# Patient Record
Sex: Female | Born: 1985 | Race: Black or African American | Marital: Married | Smoking: Never smoker
Health system: Southern US, Community
[De-identification: ages and names within clinical notes are randomized; demographics above are authoritative.]

## PROBLEM LIST (undated history)

## (undated) DIAGNOSIS — Q210 Ventricular septal defect: Secondary | ICD-10-CM

## (undated) HISTORY — DX: Ventricular septal defect: Q21.0

---
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
## 2018-05-01 ENCOUNTER — Encounter: Payer: Self-pay | Admitting: Physician Assistant

## 2018-05-01 ENCOUNTER — Ambulatory Visit (INDEPENDENT_AMBULATORY_CARE_PROVIDER_SITE_OTHER): Payer: Managed Care, Other (non HMO) | Admitting: Physician Assistant

## 2018-05-01 VITALS — BP 120/72 | HR 68 | Temp 98.4°F | Ht 67.0 in | Wt 186.5 lb

## 2018-05-01 DIAGNOSIS — R300 Dysuria: Secondary | ICD-10-CM

## 2018-05-01 LAB — POCT URINALYSIS DIP (MANUAL ENTRY)
Bilirubin, UA: NEGATIVE
Glucose, UA: NEGATIVE mg/dL
Ketones, POC UA: NEGATIVE mg/dL
Nitrite, UA: NEGATIVE
PH UA: 6 (ref 5.0–8.0)
Protein Ur, POC: NEGATIVE mg/dL
Spec Grav, UA: 1.02 (ref 1.010–1.025)
Urobilinogen, UA: 0.2 E.U./dL

## 2018-05-01 LAB — POCT URINE PREGNANCY: Preg Test, Ur: NEGATIVE

## 2018-05-01 MED ORDER — NITROFURANTOIN MONOHYD MACRO 100 MG PO CAPS: 100.0000 mg | ORAL_CAPSULE | Freq: Two times a day (BID) | ORAL | 0 refills | Status: DC

## 2018-05-01 NOTE — Patient Instructions (Signed)
It was great to see you!  Start macrobid. We will be in touch with your culture results.  If it is too expensive, let us know.  Take care,  Jarold Motto PA-C  Contact a doctor if:  You do not get better after 1-2 days.  Your symptoms go away and then come back. Get help right away if:  You have very bad back pain.  You have very bad pain in your lower belly.  You have a fever.  You are sick to your stomach (nauseous).  You are throwing up. Summary  A urinary tract infection (UTI) is an infection of any part of the urinary tract.  This condition is caused by germs in your genital area.  There are many risk factors for a UTI. These include having a small, thin tube to drain pee and not being able to control when you pee or poop.  Treatment includes antibiotic medicines for germs.  Drink enough fluid to keep your pee pale yellow. This information is not intended to replace advice given to you by your health care provider. Make sure you discuss any questions you have with your health care provider.

## 2018-05-01 NOTE — Progress Notes (Signed)
Brittany Levine is a 33 y.o. female here for a new problem.  History of Present Illness:   Chief Complaint  Patient presents with  . Urinary Tract Infection    Burning, odor x3-4 days    Urinary Tract Infection   This is a new problem. The current episode started in the past 7 days. The problem occurs every urination. The quality of the pain is described as burning. The pain is mild. There has been no fever. She is sexually active. Associated symptoms include frequency and urgency. Pertinent negatives include no chills, discharge, flank pain, hematuria, hesitancy, nausea, possible pregnancy, sweats or vomiting. She has tried increased fluids for the symptoms. The treatment provided mild relief. Her past medical history is significant for recurrent UTIs.   Patient's last menstrual period was 04/27/2018.   Past Medical History:  Diagnosis Date  . VSD (ventricular septal defect)      Social History   Socioeconomic History  . Marital status: Married    Spouse name: Not on file  . Number of children: 1  . Years of education: Not on file  . Highest education level: Not on file  Occupational History  . Not on file  Social Needs  . Financial resource strain: Not on file  . Food insecurity:    Worry: Not on file    Inability: Not on file  . Transportation needs:    Medical: Not on file    Non-medical: Not on file  Tobacco Use  . Smoking status: Never Smoker  . Smokeless tobacco: Never Used  Substance and Sexual Activity  . Alcohol use: Yes  . Drug use: Never  . Sexual activity: Yes    Birth control/protection: None  Lifestyle  . Physical activity:    Days per week: Not on file    Minutes per session: Not on file  . Stress: Not on file  Relationships  . Social connections:    Talks on phone: Not on file    Gets together: Not on file    Attends religious service: Not on file    Active member of club or organization: Not on file    Attends meetings of clubs or  organizations: Not on file    Relationship status: Not on file  . Intimate partner violence:    Fear of current or ex partner: Not on file    Emotionally abused: Not on file    Physically abused: Not on file    Forced sexual activity: Not on file  Other Topics Concern  . Not on file  Social History Narrative   From [place], [place]   Works in IT   Has 33 y/o dtr   Married    Past Surgical History:  Procedure Laterality Date  . CESAREAN SECTION  2013  . RIGHT AND LEFT HEART CATH  1989    Family History  Problem Relation Age of Onset  . Prostate cancer Maternal Grandfather   . Birth defects Mother   . Miscarriages / [place] Mother   . Heart disease Maternal Grandmother   . Hypertension Maternal Grandmother     No Known Allergies  Current Medications:   Current Outpatient Medications:  .  Prenatal Vit-Fe Fumarate-FA (PRENATAL MULTIVITAMIN) TABS tablet, Take 1 tablet by mouth daily at 12 noon., Disp: , Rfl:  .  nitrofurantoin, macrocrystal-monohydrate, (MACROBID) 100 MG capsule, Take 1 capsule (100 mg total) by mouth 2 (two) times daily., Disp: 10 capsule, Rfl: 0   Review of Systems:  Review of Systems  Constitutional: Negative for chills.  Gastrointestinal: Negative for nausea and vomiting.  Genitourinary: Positive for frequency and urgency. Negative for flank pain, hematuria and hesitancy.    Vitals:   Vitals:   05/01/18 1402  BP: 120/72  Pulse: 68  Temp: 98.4 F (36.9 C)  TempSrc: Oral  SpO2: 99%  Weight: 186 lb 8 oz (84.6 kg)  Height: 5\' 7"  (1.702 m)     Body mass index is 29.21 kg/m.  Physical Exam:   Physical Exam Vitals signs and nursing note reviewed.  Constitutional:      General: She is not in acute distress.    Appearance: Normal appearance. She is well-developed. She is not ill-appearing or toxic-appearing.  Cardiovascular:     Rate and Rhythm: Normal rate and regular rhythm.     Pulses: Normal pulses.     Heart sounds: S1 normal and S2  normal. Murmur present.  Pulmonary:     Effort: Pulmonary effort is normal.     Breath sounds: Normal breath sounds.  Abdominal:     General: Bowel sounds are normal.     Tenderness: There is abdominal tenderness in the suprapubic area.  Skin:    General: Skin is warm and dry.  Neurological:     Mental Status: She is alert.     GCS: GCS eye subscore is 4. GCS verbal subscore is 5. GCS motor subscore is 6.  Psychiatric:        Speech: Speech normal.        Behavior: Behavior normal. Behavior is cooperative.     Results for orders placed or performed in visit on 05/01/18  POCT urinalysis dipstick  Result Value Ref Range   Color, UA yellow yellow   Clarity, UA clear clear   Glucose, UA negative negative mg/dL   Bilirubin, UA negative negative   Ketones, POC UA negative negative mg/dL   Spec Grav, UA 6.433 2.951 - 1.025   Blood, UA trace-lysed (A) negative   pH, UA 6.0 5.0 - 8.0   Protein Ur, POC negative negative mg/dL   Urobilinogen, UA 0.2 0.2 or 1.0 E.U./dL   Nitrite, UA Negative Negative   Leukocytes, UA Large (3+) (A) Negative  POCT urine pregnancy  Result Value Ref Range   Preg Test, Ur Negative Negative    Assessment and Plan:   Brittany Levine  was seen today for urinary tract infection.  Diagnoses and all orders for this visit:  Dysuria UA negative. Suspect acute cystitis. Start oral macrobid. Of note, she states that her old provider would use either bactrim or levaquin, but she doesn't remember trying any other antibiotics for UTI. Will trial macrobid and await culture. Worsening precautions advised. -     POCT urinalysis dipstick -     POCT urine pregnancy -     Urine Culture  Other orders -     nitrofurantoin, macrocrystal-monohydrate, (MACROBID) 100 MG capsule; Take 1 capsule (100 mg total) by mouth 2 (two) times daily.   . Reviewed expectations re: course of current medical issues. . Discussed self-management of symptoms. . Outlined signs and symptoms  indicating need for more acute intervention. . Patient verbalized understanding and all questions were answered. . See orders for this visit as documented in the electronic medical record. . Patient received an After-Visit Summary  Jarold Motto, PA-C

## 2018-05-03 ENCOUNTER — Ambulatory Visit: Payer: Self-pay | Admitting: Physician Assistant

## 2018-05-03 LAB — URINE CULTURE
MICRO NUMBER:: 233396
SPECIMEN QUALITY:: ADEQUATE

## 2018-07-05 ENCOUNTER — Other Ambulatory Visit: Payer: Self-pay

## 2018-07-05 ENCOUNTER — Encounter: Payer: Self-pay | Admitting: Physician Assistant

## 2018-07-05 ENCOUNTER — Ambulatory Visit (INDEPENDENT_AMBULATORY_CARE_PROVIDER_SITE_OTHER): Payer: Managed Care, Other (non HMO) | Admitting: Physician Assistant

## 2018-07-05 ENCOUNTER — Ambulatory Visit: Payer: Managed Care, Other (non HMO) | Admitting: Physician Assistant

## 2018-07-05 ENCOUNTER — Other Ambulatory Visit (INDEPENDENT_AMBULATORY_CARE_PROVIDER_SITE_OTHER): Payer: Managed Care, Other (non HMO)

## 2018-07-05 DIAGNOSIS — R300 Dysuria: Secondary | ICD-10-CM

## 2018-07-05 DIAGNOSIS — R109 Unspecified abdominal pain: Secondary | ICD-10-CM

## 2018-07-05 LAB — POCT URINALYSIS DIPSTICK
Bilirubin, UA: NEGATIVE
Blood, UA: NEGATIVE
Glucose, UA: NEGATIVE
Ketones, UA: NEGATIVE
Nitrite, UA: NEGATIVE
Protein, UA: NEGATIVE
Spec Grav, UA: 1.015 (ref 1.010–1.025)
Urobilinogen, UA: 0.2 E.U./dL
pH, UA: 8.5 — AB (ref 5.0–8.0)

## 2018-07-05 MED ORDER — CIPROFLOXACIN HCL 500 MG PO TABS: 500.0000 mg | ORAL_TABLET | Freq: Two times a day (BID) | ORAL | 0 refills | Status: AC

## 2018-07-05 NOTE — Progress Notes (Signed)
Virtual Visit via Video   I connected with Brittany Levine  Brittany Levine on 07/05/18 at  1:20 PM EDT by a video enabled telemedicine application and verified that I am speaking with the correct person using two identifiers. Location patient: Home Location provider: [facility], Office Persons participating in the virtual visit: Brittany Levine, Jarold MottoSamantha Eissa Buchberger, [place]   I discussed the limitations of evaluation and management by telemedicine and the availability of in person appointments. The patient expressed understanding and agreed to proceed.  Subjective:   HPI:   Patient reports 1 week history of flank pain, worsening with time. She has also had foul urine odor x 1 day, which resolved after pushing fluids. She has history of both UTI and pyelo in the past. No hx of kidney stones. Denies: fevers, chills, nausea, vomiting, poor appetite.  Patient's last menstrual period was 06/18/2018.   ROS: See pertinent positives and negatives per HPI.  There are no active problems to display for this patient.   Social History   Tobacco Use  . Smoking status: Never Smoker  . Smokeless tobacco: Never Used  Substance Use Topics  . Alcohol use: Yes    Current Outpatient Medications:  .  Prenatal Vit-Fe Fumarate-FA (PRENATAL MULTIVITAMIN) TABS tablet, Take 1 tablet by mouth daily at 12 noon., Disp: , Rfl:  .  ciprofloxacin (CIPRO) 500 MG tablet, Take 1 tablet (500 mg total) by mouth 2 (two) times daily for 10 days., Disp: 20 tablet, Rfl: 0  No Known Allergies  Objective:   VITALS: Per patient if applicable, see vitals. GENERAL: Alert, appears well and in no acute distress. HEENT: Atraumatic, conjunctiva clear, no obvious abnormalities on inspection of external nose and ears. NECK: Normal movements of the head and neck. CARDIOPULMONARY: No increased WOB. Speaking in clear sentences. I:E ratio WNL.  MS: Moves all visible extremities without noticeable abnormality. PSYCH: Pleasant and  cooperative, well-groomed. Speech normal rate and rhythm. Affect is appropriate. Insight and judgement are appropriate. Attention is focused, linear, and appropriate.  NEURO: CN grossly intact. Oriented as arrived to appointment on time with no prompting. Moves both UE equally.  SKIN: No obvious lesions, wounds, erythema, or cyanosis noted on face or hands.  Results for orders placed or performed in visit on 07/05/18  POCT urinalysis dipstick  Result Value Ref Range   Color, UA Yellow    Clarity, UA Clear    Glucose, UA Negative Negative   Bilirubin, UA Negative    Ketones, UA Negative    Spec Grav, UA 1.015 1.010 - 1.025   Blood, UA Negative    pH, UA 8.5 (A) 5.0 - 8.0   Protein, UA Negative Negative   Urobilinogen, UA 0.2 0.2 or 1.0 E.U./dL   Nitrite, UA Negative    Leukocytes, UA Small (1+) (A) Negative   Appearance     Odor       Assessment and Plan:   Brittany Levine  was seen today for acute visit.  Diagnoses and all orders for this visit:  Flank pain  Other orders -     ciprofloxacin (CIPRO) 500 MG tablet; Take 1 tablet (500 mg total) by mouth 2 (two) times daily for 10 days.   UA concerning for infection. She has used cipro in the past and tolerated well. Denies current pregnancy. Culture pending.  . Reviewed expectations re: course of current medical issues. . Discussed self-management of symptoms. . Outlined signs and symptoms indicating need for more acute intervention. . Patient verbalized  understanding and all questions were answered. Marland Kitchen Health Maintenance issues including appropriate healthy diet, exercise, and smoking avoidance were discussed with patient. . See orders for this visit as documented in the electronic medical record.  I discussed the assessment and treatment plan with the patient. The patient was provided an opportunity to ask questions and all were answered. The patient agreed with the plan and demonstrated an understanding of the instructions.   The  patient was advised to call back or seek an in-person evaluation if the symptoms worsen or if the condition fails to improve as anticipated.    [place], [place] 07/05/2018

## 2018-07-06 LAB — URINE CULTURE
MICRO NUMBER:: 432015
SPECIMEN QUALITY:: ADEQUATE

## 2018-07-10 ENCOUNTER — Ambulatory Visit (INDEPENDENT_AMBULATORY_CARE_PROVIDER_SITE_OTHER): Payer: Managed Care, Other (non HMO) | Admitting: Physician Assistant

## 2018-07-10 ENCOUNTER — Ambulatory Visit (INDEPENDENT_AMBULATORY_CARE_PROVIDER_SITE_OTHER)
Admission: RE | Admit: 2018-07-10 | Discharge: 2018-07-10 | Disposition: A | Discharge status: Home / Self Care | Payer: Managed Care, Other (non HMO) | Source: Ambulatory Visit | Attending: Physician Assistant | Admitting: Physician Assistant

## 2018-07-10 ENCOUNTER — Other Ambulatory Visit: Payer: Self-pay

## 2018-07-10 ENCOUNTER — Telehealth: Payer: Self-pay | Admitting: Physician Assistant

## 2018-07-10 ENCOUNTER — Telehealth: Payer: Self-pay | Admitting: *Deleted

## 2018-07-10 ENCOUNTER — Encounter: Payer: Self-pay | Admitting: Physician Assistant

## 2018-07-10 DIAGNOSIS — R109 Unspecified abdominal pain: Secondary | ICD-10-CM | POA: Diagnosis not present

## 2018-07-10 LAB — COMPREHENSIVE METABOLIC PANEL
ALT: 19 U/L (ref 0–35)
AST: 16 U/L (ref 0–37)
Albumin: 4.3 g/dL (ref 3.5–5.2)
Alkaline Phosphatase: 41 U/L (ref 39–117)
BUN: 8 mg/dL (ref 6–23)
CO2: 25 mEq/L (ref 19–32)
Calcium: 9.2 mg/dL (ref 8.4–10.5)
Chloride: 102 mEq/L (ref 96–112)
Creatinine, Ser: 0.73 mg/dL (ref 0.40–1.20)
GFR: 111.3 mL/min (ref >60.00)
Glucose, Bld: 64 mg/dL — ABNORMAL LOW (ref 70–99)
Potassium: 4.2 mEq/L (ref 3.5–5.1)
Sodium: 136 mEq/L (ref 135–145)
Total Bilirubin: 0.5 mg/dL (ref 0.2–1.2)
Total Protein: 7.5 g/dL (ref 6.0–8.3)

## 2018-07-10 LAB — CBC WITH DIFFERENTIAL/PLATELET
Basophils Absolute: 0 10*3/uL (ref 0.0–0.1)
Basophils Relative: 0.6 % (ref 0.0–3.0)
Eosinophils Absolute: 0 10*3/uL (ref 0.0–0.7)
Eosinophils Relative: 1.1 % (ref 0.0–5.0)
HCT: 40.8 % (ref 36.0–46.0)
Hemoglobin: 13.4 g/dL (ref 12.0–15.0)
Lymphocytes Relative: 50.3 % — ABNORMAL HIGH (ref 12.0–46.0)
Lymphs Abs: 2.3 10*3/uL (ref 0.7–4.0)
MCHC: 32.8 g/dL (ref 30.0–36.0)
MCV: 84.6 fl (ref 78.0–100.0)
Monocytes Absolute: 0.4 10*3/uL (ref 0.1–1.0)
Monocytes Relative: 8.3 % (ref 3.0–12.0)
Neutro Abs: 1.9 10*3/uL (ref 1.4–7.7)
Neutrophils Relative %: 39.7 % — ABNORMAL LOW (ref 43.0–77.0)
Platelets: 182 10*3/uL (ref 150.0–400.0)
RBC: 4.83 Mil/uL (ref 3.87–5.11)
RDW: 13.9 % (ref 11.5–15.5)
WBC: 4.7 10*3/uL (ref 4.0–10.5)

## 2018-07-10 LAB — URINALYSIS, ROUTINE W REFLEX MICROSCOPIC
Bilirubin Urine: NEGATIVE
Hgb urine dipstick: NEGATIVE
Ketones, ur: NEGATIVE
Nitrite: NEGATIVE
Specific Gravity, Urine: 1.025 (ref 1.000–1.030)
Total Protein, Urine: NEGATIVE
Urine Glucose: NEGATIVE
Urobilinogen, UA: 0.2 (ref 0.0–1.0)
pH: 5.5 (ref 5.0–8.0)

## 2018-07-10 LAB — POCT URINE PREGNANCY: Preg Test, Ur: NEGATIVE

## 2018-07-10 NOTE — Telephone Encounter (Signed)
See note  Copied from CRM [id number]. Topic: General - Other >> Jul 10, 2018  9:23 AM Herby Abraham C wrote: Reason for CRM: pt was prescribed ciprofloxacin (CIPRO) 500 MG tablet. Pt says that she has almost completed medication and is not feeling any better. Pt would like to be advised further.   CB: [phone]

## 2018-07-10 NOTE — Telephone Encounter (Signed)
Spoke to pt asked her what is wrong. Pt said she is still having Flank pain R>L. Told pt urine culture was negative can stop antibiotic per Lelon Mast. PT verbalized understanding. Pt denies dysuria, frequency, no hematuria  Just having pain. Doxy visit scheduled for Sam to day at 10:40. Pt verbalized understanding.

## 2018-07-10 NOTE — Telephone Encounter (Signed)
Please obtain more information about what symptoms she is still having. Please offer doxy.

## 2018-07-10 NOTE — Telephone Encounter (Signed)
Covid-19 travel screening questions  Have you traveled in the last 14 days? If yes where? No Do you now or have you had a fever in the last 14 days? No Do you have any respiratory symptoms of shortness of breath or cough now or in the last 14 days? No Do you have a medical history of Congestive Heart Failure? No Do you have a medical history of lung disease? No Do you have any family members or close contacts with diagnosed or suspected Covid-19? No      

## 2018-07-10 NOTE — Progress Notes (Signed)
Virtual Visit via Video   I connected with Brittany Levine on 07/11/18 at 10:40 AM EDT by a video enabled telemedicine application and verified that I am speaking with the correct person using two identifiers. Location patient: Home Location provider: [facility], Office Persons participating in the virtual visit: Mellissa Thau, Jarold Motto, [place]   I discussed the limitations of evaluation and management by telemedicine and the availability of in person appointments. The patient expressed understanding and agreed to proceed.  Subjective:   HPI:  Flank pain Pt c/o bilateral flank pain R>L. She was seen on 07/05/18 for similar symptoms. UA showed small leuks and she was treated with cipro. Culture was negative for significant growth. Pt denies dysuria, frequency with urination, hematuria, poor appetite. She rates her pain 8/10, worse when moving.  Patient's last menstrual period was 06/18/2018. Husband has had vasectomy.   ROS: See pertinent positives and negatives per HPI.  There are no active problems to display for this patient.   Social History   Tobacco Use  . Smoking status: Never Smoker  . Smokeless tobacco: Never Used  Substance Use Topics  . Alcohol use: Yes    Current Outpatient Medications:  .  ciprofloxacin (CIPRO) 500 MG tablet, Take 1 tablet (500 mg total) by mouth 2 (two) times daily for 10 days., Disp: 20 tablet, Rfl: 0 .  Prenatal Vit-Fe Fumarate-FA (PRENATAL MULTIVITAMIN) TABS tablet, Take 1 tablet by mouth daily at 12 noon., Disp: , Rfl:   No Known Allergies  Objective:   VITALS: Per patient if applicable, see vitals. GENERAL: Alert, appears well and in no acute distress. HEENT: Atraumatic, conjunctiva clear, no obvious abnormalities on inspection of external nose and ears. NECK: Normal movements of the head and neck. CARDIOPULMONARY: No increased WOB. Speaking in clear sentences. I:E ratio WNL.  MS: Moves all visible extremities without  noticeable abnormality. PSYCH: Pleasant and cooperative, well-groomed. Speech normal rate and rhythm. Affect is appropriate. Insight and judgement are appropriate. Attention is focused, linear, and appropriate.  NEURO: CN grossly intact. Oriented as arrived to appointment on time with no prompting. Moves both UE equally.  SKIN: No obvious lesions, wounds, erythema, or cyanosis noted on face or hands.  Results for orders placed or performed in visit on 07/10/18  CBC with Differential/Platelet  Result Value Ref Range   WBC 4.7 4.0 - 10.5 K/uL   RBC 4.83 3.87 - 5.11 Mil/uL   Hemoglobin 13.4 12.0 - 15.0 g/dL   HCT 20.2 54.2 - 70.6 %   MCV 84.6 78.0 - 100.0 fl   MCHC 32.8 30.0 - 36.0 g/dL   RDW 23.7 62.8 - 31.5 %   Platelets 182.0 150.0 - 400.0 K/uL   Neutrophils Relative % 39.7 (L) 43.0 - 77.0 %   Lymphocytes Relative 50.3 (H) 12.0 - 46.0 %   Monocytes Relative 8.3 3.0 - 12.0 %   Eosinophils Relative 1.1 0.0 - 5.0 %   Basophils Relative 0.6 0.0 - 3.0 %   Neutro Abs 1.9 1.4 - 7.7 K/uL   Lymphs Abs 2.3 0.7 - 4.0 K/uL   Monocytes Absolute 0.4 0.1 - 1.0 K/uL   Eosinophils Absolute 0.0 0.0 - 0.7 K/uL   Basophils Absolute 0.0 0.0 - 0.1 K/uL  Comprehensive metabolic panel  Result Value Ref Range   Sodium 136 135 - 145 mEq/L   Potassium 4.2 3.5 - 5.1 mEq/L   Chloride 102 96 - 112 mEq/L   CO2 25 19 - 32  mEq/L   Glucose, Bld 64 (L) 70 - 99 mg/dL   BUN 8 6 - 23 mg/dL   Creatinine, Ser 1.610.73 0.40 - 1.20 mg/dL   Total Bilirubin 0.5 0.2 - 1.2 mg/dL   Alkaline Phosphatase 41 39 - 117 U/L   AST 16 0 - 37 U/L   ALT 19 0 - 35 U/L   Total Protein 7.5 6.0 - 8.3 g/dL   Albumin 4.3 3.5 - 5.2 g/dL   Calcium 9.2 8.4 - 09.610.5 mg/dL   GFR 045.40111.30 >98.11>60.00 mL/min  Urinalysis, Routine w reflex microscopic  Result Value Ref Range   Color, Urine YELLOW Yellow;Lt. Yellow;Straw;Dark Yellow;Amber;Green;Red;Brown   APPearance CLEAR Clear;Turbid;Slightly Cloudy;Cloudy   Specific Gravity, Urine 1.025 1.000 - 1.030    pH 5.5 5.0 - 8.0   Total Protein, Urine NEGATIVE Negative   Urine Glucose NEGATIVE Negative   Ketones, ur NEGATIVE Negative   Bilirubin Urine NEGATIVE Negative   Hgb urine dipstick NEGATIVE Negative   Urobilinogen, UA 0.2 0.0 - 1.0   Leukocytes,Ua TRACE (A) Negative   Nitrite NEGATIVE Negative   WBC, UA 0-2/hpf 0-2/hpf   RBC / HPF 0-2/hpf 0-2/hpf   Squamous Epithelial / LPF Rare(0-4/hpf) Rare(0-4/hpf)  POCT urine pregnancy  Result Value Ref Range   Preg Test, Ur Negative Negative     Assessment and Plan:   Brittany Levine  was seen today for flank pain.  Diagnoses and all orders for this visit:  Flank pain -     CBC with Differential/Platelet -     Comprehensive metabolic panel -     Urinalysis, Routine w reflex microscopic -     Urine Culture -     POCT urine pregnancy -     CT RENAL STONE STUDY; Future   Will check urinalysis, culture, labs and CT scan for further work-up. ER precautions advised.  . Reviewed expectations re: course of current medical issues. . Discussed self-management of symptoms. . Outlined signs and symptoms indicating need for more acute intervention. . Patient verbalized understanding and all questions were answered. Marland Kitchen. Health Maintenance issues including appropriate healthy diet, exercise, and smoking avoidance were discussed with patient. . See orders for this visit as documented in the electronic medical record.  I discussed the assessment and treatment plan with the patient. The patient was provided an opportunity to ask questions and all were answered. The patient agreed with the plan and demonstrated an understanding of the instructions.   The patient was advised to call back or seek an in-person evaluation if the symptoms worsen or if the condition fails to improve as anticipated.    [place] CreekSamantha Jaqwon [place], [place] 07/11/2018

## 2018-07-10 NOTE — Telephone Encounter (Signed)
Please see message and advise 

## 2018-07-11 ENCOUNTER — Encounter: Payer: Self-pay | Admitting: Physician Assistant

## 2018-07-12 LAB — URINE CULTURE
MICRO NUMBER:: 442628
Result:: NO GROWTH
SPECIMEN QUALITY:: ADEQUATE

## 2018-07-17 ENCOUNTER — Other Ambulatory Visit: Payer: Self-pay

## 2018-07-17 ENCOUNTER — Encounter: Payer: Self-pay | Admitting: Physician Assistant

## 2018-07-17 ENCOUNTER — Ambulatory Visit (INDEPENDENT_AMBULATORY_CARE_PROVIDER_SITE_OTHER): Payer: Managed Care, Other (non HMO) | Admitting: Physician Assistant

## 2018-07-17 DIAGNOSIS — E162 Hypoglycemia, unspecified: Secondary | ICD-10-CM

## 2018-07-17 NOTE — Progress Notes (Signed)
Virtual Visit via Video   I connected with Brittany Levine on 07/17/18 at  4:00 PM EDT by a video enabled telemedicine application and verified that I am speaking with the correct person using two identifiers. Location patient: Home Location provider: [facility], Office Persons participating in the virtual visit: Brittany Levine, Jarold MottoSamantha Haly Feher, [place], [name], LPN  I discussed the limitations of evaluation and management by telemedicine and the availability of in person appointments. The patient expressed understanding and agreed to proceed.  I acted as a Neurosurgeonscribe for Energy East CorporationSamantha Herold Salguero, Avon ProductsPA-C [name], LPN  Subjective:   HPI: Pt following up on low blood sugar found on labs done 5/4.   Lightheadedness Pt says she has been having symptoms of dizziness, lightheadedness for years, but states that she thought it was from getting up too fast per her cardiologist. Pt said she was told prior to pregnancy that her blood sugar was low, but after daughter was born blood sugar was "back to normal" so she didn't really think much about it. Pt said when she has these symptoms of dizziness and lightheaded she will drink regular soda or gatorade and then she typically feels better. Pt said she was lightheaded this morning when she was working out and stopped and drank something and then felt better.  She has been making an effort of trying to have CHO-containing sources throughout the day since she was told about her labs.  ROS: See pertinent positives and negatives per HPI.  There are no active problems to display for this patient.   Social History   Tobacco Use  . Smoking status: Never Smoker  . Smokeless tobacco: Never Used  Substance Use Topics  . Alcohol use: Yes    Current Outpatient Medications:  .  Prenatal Vit-Fe Fumarate-FA (PRENATAL MULTIVITAMIN) TABS tablet, Take 1 tablet by mouth daily at 12 noon., Disp: , Rfl:   No Known Allergies  Objective:   VITALS: Per  patient if applicable, see vitals. GENERAL: Alert, appears well and in no acute distress. HEENT: Atraumatic, conjunctiva clear, no obvious abnormalities on inspection of external nose and ears. NECK: Normal movements of the head and neck. CARDIOPULMONARY: No increased WOB. Speaking in clear sentences. I:E ratio WNL.  MS: Moves all visible extremities without noticeable abnormality. PSYCH: Pleasant and cooperative, well-groomed. Speech normal rate and rhythm. Affect is appropriate. Insight and judgement are appropriate. Attention is focused, linear, and appropriate.  NEURO: CN grossly intact. Oriented as arrived to appointment on time with no prompting. Moves both UE equally.  SKIN: No obvious lesions, wounds, erythema, or cyanosis noted on face or hands.  Assessment and Plan:   Brittany Levine  was seen today for discuss blood sugar and dizziness.  Diagnoses and all orders for this visit:  Hypoglycemia -     Ambulatory referral to Endocrinology   Will send to endocrinology for further work-up. She is aware of when she becomes symptomatic and knows to carry sources of carbohydrates around regularly in case she needs them. I have also given her a glucometer to measure her blood sugar when she has symptoms for endocrinology to review.  . Reviewed expectations re: course of current medical issues. . Discussed self-management of symptoms. . Outlined signs and symptoms indicating need for more acute intervention. . Patient verbalized understanding and all questions were answered. Marland Kitchen. Health Maintenance issues including appropriate healthy diet, exercise, and smoking avoidance were discussed with patient. . See orders for this visit as documented in  the electronic medical record.  I discussed the assessment and treatment plan with the patient. The patient was provided an opportunity to ask questions and all were answered. The patient agreed with the plan and demonstrated an understanding of the  instructions.   The patient was advised to call back or seek an in-person evaluation if the symptoms worsen or if the condition fails to improve as anticipated.   CMA or LPN served as scribe during this visit. History, Physical, and Plan performed by medical provider. The above documentation has been reviewed and is accurate and complete.  [place], [place] 07/17/2018

## 2018-07-26 ENCOUNTER — Ambulatory Visit (INDEPENDENT_AMBULATORY_CARE_PROVIDER_SITE_OTHER): Payer: Managed Care, Other (non HMO) | Admitting: Internal Medicine

## 2018-07-26 ENCOUNTER — Other Ambulatory Visit: Payer: Self-pay

## 2018-07-26 ENCOUNTER — Encounter: Payer: Self-pay | Admitting: Internal Medicine

## 2018-07-26 DIAGNOSIS — R42 Dizziness and giddiness: Secondary | ICD-10-CM | POA: Diagnosis not present

## 2018-07-26 NOTE — Progress Notes (Signed)
Virtual Visit via Video Note  I connected with Brittany Levine  Brittany Levine on 07/26/18 at  8:50 AM EDT by a video enabled telemedicine application and verified that I am speaking with the correct person using two identifiers.   I discussed the limitations of evaluation and management by telemedicine and the availability of in person appointments. The patient expressed understanding and agreed to proceed.  -Location of the patient : Home  -Location of the provider : office -The names of all persons participating in the telemedicine service : Pt and myself     Name: Brittany Levine  Brittany Harth  MRN/ DOB: [id number], [birth year]    Age/ Sex: 33 y.o., female    PCP: Jarold MottoWorley, Samantha, PA   Reason for Endocrinology Evaluation: Hypoglycemia     Date of Initial Endocrinology Evaluation: 07/26/2018     HPI: [name]  Brittany Levine is a 33 y.o. female with a past medical history of VSD. The patient presented for initial endocrinology clinic visit on 07/26/2018 for consultative assistance with her Hypoglycemia   Pt is sent here for further evaluation of hypoglycemia after she was found to have a serum glucose of 64 mg/dL. She was being evaluated for a UTI at the time.  Pt does endorse positional lightheadedness, and dizziness since the age of 33. This has been attributes to her hx of VSD as its mainly positional. She does note resolution of symptoms with drinking a regular soda or a gatorade.   She is athletic and exercises 4-5x a day. She does notice these symptoms during exercise and when she wakes up in the morning when trying to get out of bed, pt also notes these symptoms when she is changing positions from a sit down to a standing position. Pt does not notice these symptoms with certain meals. She also does not recall if she felt any different during the lab testing.   Pt has been checking glucose with BG's 68-127 mg/dL.    No close contact with someone with diabetes  She works in IT     HISTORY:   Past Medical History:  Past Medical History:  Diagnosis Date  . VSD (ventricular septal defect)     Past Surgical History:  Past Surgical History:  Procedure Laterality Date  . CESAREAN SECTION  2013  . RIGHT AND LEFT HEART CATH  1989      Social History:  reports that she has never smoked. She has never used smokeless tobacco. She reports current alcohol use. She reports that she does not use drugs.  Family History: family history includes Birth defects in her mother; Heart disease in her maternal grandmother; Hypertension in her maternal grandmother; Miscarriages / [place]s in her mother; Prostate cancer in her maternal grandfather.   HOME MEDICATIONS: Allergies as of 07/26/2018   No Known Allergies     Medication List       Accurate as of Jul 26, 2018  8:07 AM. If you have any questions, ask your nurse or doctor.        prenatal multivitamin Tabs tablet Take 1 tablet by mouth daily at 12 noon.         REVIEW OF SYSTEMS: A comprehensive ROS was conducted with the patient and is negative except as per HPI and below:  Review of Systems  Constitutional: Negative for fever and weight loss.  HENT: Negative for congestion and sore throat.   Eyes: Negative for blurred vision and pain.  Respiratory: Negative for cough and shortness of breath.  Cardiovascular: Negative for chest pain and palpitations.  Gastrointestinal: Negative for diarrhea and nausea.  Genitourinary: Negative for frequency.  Neurological: Negative for tingling and tremors.  Endo/Heme/Allergies: Negative for polydipsia.  Psychiatric/Behavioral: Negative for depression. The patient is not nervous/anxious.         DATA REVIEWED: Results for VICIE, BRAUTIGAN  [facility] (MRN [id number]) as of 07/26/2018 10:08  Ref. Range 07/10/2018 11:59  Sodium Latest Ref Range: 135 - 145 mEq/L 136  Potassium Latest Ref Range: 3.5 - 5.1 mEq/L 4.2  Chloride Latest Ref Range: 96 - 112 mEq/L 102  CO2 Latest Ref Range: 19 -  32 mEq/L 25  Glucose Latest Ref Range: 70 - 99 mg/dL 64 (L)  BUN Latest Ref Range: 6 - 23 mg/dL 8  Creatinine Latest Ref Range: 0.40 - 1.20 mg/dL 3.15  Calcium Latest Ref Range: 8.4 - 10.5 mg/dL 9.2  Alkaline Phosphatase Latest Ref Range: 39 - 117 U/L 41  Albumin Latest Ref Range: 3.5 - 5.2 g/dL 4.3  AST Latest Ref Range: 0 - 37 U/L 16  ALT Latest Ref Range: 0 - 35 U/L 19  Total Protein Latest Ref Range: 6.0 - 8.3 g/dL 7.5  Total Bilirubin Latest Ref Range: 0.2 - 1.2 mg/dL 0.5  GFR Latest Ref Range: >60.00 mL/min 111.30      ASSESSMENT/PLAN/RECOMMENDATIONS:   1. Dizziness :  - Pt does not meet criteria for Whipple's Triad  - I have a low suspicion for insulinoma in this pt.  - Her history of low glucose during pregnancy is consistent with normal physiology during pregnancy - Having a serum glucose of 64 mg/dL in a healthy female who is not on any hypoglycemic agents is acceptable . Women generally could handle BG's in the high 50's mg/dL.  - Her BG's at home have been 68-127 mg/dL. Pt encouraged to continued glucose checks when symptomatic, the patient to contact us if her glucose readings are consistently in the mid 50's or less.   - Recent CT imaging are normal with no evidence of pancreatic tumors. - I do agree with cardiology that her dizziness is cardiac related given the nature of symptoms with change in positions.    I discussed the assessment and treatment plan with the patient. The patient was provided an opportunity to ask questions and all were answered. The patient agreed with the plan and demonstrated an understanding of the instructions.   The patient was advised to call back or seek an in-person evaluation if the symptoms worsen or if the condition fails to improve as anticipated.  F/u PRN    Signed electronically by: Lyndle Herrlich, MD  [facility] Endocrinology  [facility] [address] [place], [place] [address] Phone:  [phone] FAX: [phone]   CC: Jarold Motto, [place] [address] [place] [address] Phone: [phone] Fax: [phone]   Return to Endocrinology clinic as below: Future Appointments  Date Time Provider Department Center  07/26/2018  8:50 AM Ramsey Midgett, Konrad Dolores, MD [facility] None

## 2018-08-30 ENCOUNTER — Other Ambulatory Visit: Payer: Self-pay

## 2018-08-30 ENCOUNTER — Encounter: Payer: Self-pay | Admitting: Family Medicine

## 2018-08-30 ENCOUNTER — Ambulatory Visit: Payer: Managed Care, Other (non HMO) | Admitting: Family Medicine

## 2018-08-30 VITALS — BP 118/70 | HR 77 | Temp 98.6°F | Ht 67.0 in | Wt 183.8 lb

## 2018-08-30 DIAGNOSIS — L739 Follicular disorder, unspecified: Secondary | ICD-10-CM | POA: Diagnosis not present

## 2018-08-30 MED ORDER — DOXYCYCLINE HYCLATE 100 MG PO TABS: 100.0000 mg | ORAL_TABLET | Freq: Two times a day (BID) | ORAL | 0 refills | Status: DC

## 2018-08-30 NOTE — Progress Notes (Signed)
   Chief Complaint:  Brittany Levine is a 33 y.o. female who presents today with a chief complaint of recurrent skin infections.   Assessment/Plan:  Folliculitis No red flags.  Start doxycycline 100 mg twice daily for 7 days.  Also recommended Hibiclens few times per week for the next week or so.  Discussed reasons to return to care.  Follow-up as needed.    Subjective:  HPI:  Recurrent Skin Infections  Started about a month ago.  She has had several areas of infection throughout her body and currently has on in her chest, right side of her neck, and left leg.  Areas are painful to touch.  She had 1 area that drained on her left buttock about a week ago.  She has never had anything like this before.  Has tried using warm compresses with no improvement.  No fevers or chills.  No new soaps, detergents, lotions, or perfumes.  No other obvious precipitating events.  No other obvious alleviating or aggravating factors.   ROS: Per HPI  PMH: She reports that she has never smoked. She has never used smokeless tobacco. She reports current alcohol use. She reports that she does not use drugs.      Objective:  Physical Exam: BP 118/70 (BP Location: Left Arm, Patient Position: Sitting, Cuff Size: Normal)   Pulse 77   Temp 98.6 F (37 C) (Oral)   Ht 5\' 7"  (1.702 m)   Wt 183 lb 12.8 oz (83.4 kg)   SpO2 98%   BMI 28.79 kg/m   Gen: NAD, resting comfortably Skin: Several scattered, discrete, erythematous papules approximately 4 mm in diameter scattered across right neck, right upper chest, and bilateral lower extremities.     Algis Greenhouse. Jerline Pain, MD 08/30/2018 9:34 AM

## 2018-08-30 NOTE — Patient Instructions (Signed)
It was very nice to see you today!  You have infected hair follicles.  This is called folliculitis.  Please start the doxycycline.  Please use the Hibiclens body wash 2-3 times over the next week.  Please let me know if your symptoms do not improve with this.  Take care, Dr Jerline Pain

## 2018-09-23 ENCOUNTER — Encounter: Payer: Self-pay | Admitting: Family Medicine

## 2018-09-23 ENCOUNTER — Other Ambulatory Visit: Payer: Self-pay | Admitting: Critical Care Medicine

## 2018-09-23 ENCOUNTER — Ambulatory Visit (INDEPENDENT_AMBULATORY_CARE_PROVIDER_SITE_OTHER): Payer: Managed Care, Other (non HMO) | Admitting: Family Medicine

## 2018-09-23 ENCOUNTER — Other Ambulatory Visit: Payer: Self-pay

## 2018-09-23 VITALS — Temp 98.6°F

## 2018-09-23 DIAGNOSIS — R509 Fever, unspecified: Secondary | ICD-10-CM

## 2018-09-23 DIAGNOSIS — R52 Pain, unspecified: Secondary | ICD-10-CM | POA: Diagnosis not present

## 2018-09-23 DIAGNOSIS — Z20822 Contact with and (suspected) exposure to covid-19: Secondary | ICD-10-CM

## 2018-09-23 NOTE — Patient Instructions (Signed)
Health Maintenance Due  Topic Date Due  . HIV Screening  10/26/2000  . PAP SMEAR-Modifier  10/27/2006    Depression screen [facility] 2/9 08/30/2018 05/01/2018  Decreased Interest 0 0  Down, Depressed, Hopeless 0 0  PHQ - 2 Score 0 0

## 2018-09-23 NOTE — Progress Notes (Signed)
Phone [phone]   Subjective:  Virtual visit via Video note. AFter hours/saturday clinic. Chief complaint: Chief Complaint  Patient presents with  . Generalized Body Aches  . Fever    This visit type was conducted due to national recommendations for restrictions regarding the COVID-19 Pandemic (e.g. social distancing).  This format is felt to be most appropriate for this patient at this time balancing risks to patient and risks to population by having him in for in person visit.  No physical exam was performed (except for noted visual exam or audio findings with Telehealth visits).    Our team/I connected with Alison Murray at  9:00 AM EDT by a video enabled telemedicine application (doxy.me or caregility through epic) and verified that I am speaking with the correct person using two identifiers.  Location patient: Home[place] Location provider: [facility], office Persons participating in the virtual visit:  patient  Our team/I discussed the limitations of evaluation and management by telemedicine and the availability of in person appointments. In light of current covid-19 pandemic, patient also understands that we are trying to protect them by minimizing in office contact if at all possible.  The patient expressed consent for telemedicine visit and agreed to proceed. Patient understands insurance will be billed.   ROS-  Pt c/o fever, chills, body aches, headache yesterday, no fever or headache today but still having body aches and chills.  Past Medical History-  Patient Active Problem List   Diagnosis Date Noted  . Orthostatic dizziness 07/26/2018    Medications- reviewed and updated Current Outpatient Medications  Medication Sig Dispense Refill  . Prenatal Vit-Fe Fumarate-FA (PRENATAL MULTIVITAMIN) TABS tablet Take 1 tablet by mouth daily at 12 noon.     No current facility-administered medications for this visit.      Objective:  Temp 98.6 F (37 C) (Oral)   LMP  09/03/2018  self reported vitals Gen: NAD, resting comfortably Lungs: nonlabored, normal respiratory rate  Skin: appears dry, no obvious rash    Assessment and Plan   # Fever/body aches S: started feeling poorly yesterday. When she got home she suddenly started with fever, body aches, chills. She was born with a VSD and has history of getting pneumonia. Has some nasal congestion on and off for several weeks. Had headaches yesterday. Fever was up to 99.1 yesterday. She denies shortness of breath.   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 Reports a lot of stress- has had 6 family members diagnosed with covid 38 and 2 of them died and has another family member in the hospital trying to get blood sugar under control. She her daughter and husband at home. She is at home most of time other than grocery shopping- husband does most of the shopping.  A/P: Patient with symptoms concerning for potential covid 19 Therefore: - testing ordered for covid 19 and information provided on drive up testing  - recommended patient watch closely for shortness of breath or confusion or worsening symptoms and if those occur he should contact us immediately  -recommended self isolation even in her home from husband until negative test AND 3 days fever free AND improvement in respiratory symptoms AND 14 days from symptom onset at minmum - work note not needed- can work from home.  -discussed tylenol/ibuprofen for body aches - if test comes back positive- any contacts she has had probably  within the last 2 weeks should get tested including daughter and husband.   ________________________________________________ We have put in an order for you to be tested for COVID-19.  You do not need an appointment to go get testing -- please proceed to one of the following drive-up testing locations at your convenience. The line closes at  3:30pm daily. Test results may take up to a week to return.  [place] Location: [address], [facility] Hours: 8a-3:45p, M-F  Recommended follow up: prn for acute issue    Lab/Order associations:   ICD-10-CM   1. Body aches  R52 Novel Coronavirus, NAA ([facility])    CANCELED: Novel Coronavirus, NAA ([facility])  2. Fever, unspecified  R50.9 Novel Coronavirus, NAA ([facility])    CANCELED: Novel Coronavirus, NAA ([facility])   Return precautions advised.  Tana ConchStephen Eliz Nigg, MD

## 2018-09-27 LAB — NOVEL CORONAVIRUS, NAA: SARS-CoV-2, NAA: NOT DETECTED

## 2018-10-26 ENCOUNTER — Encounter: Payer: Self-pay | Admitting: Physician Assistant

## 2018-10-26 ENCOUNTER — Ambulatory Visit (INDEPENDENT_AMBULATORY_CARE_PROVIDER_SITE_OTHER): Payer: Managed Care, Other (non HMO) | Admitting: Physician Assistant

## 2018-10-26 VITALS — Ht 67.0 in | Wt 180.0 lb

## 2018-10-26 DIAGNOSIS — Q210 Ventricular septal defect: Secondary | ICD-10-CM | POA: Diagnosis not present

## 2018-10-26 DIAGNOSIS — H9201 Otalgia, right ear: Secondary | ICD-10-CM

## 2018-10-26 DIAGNOSIS — I459 Conduction disorder, unspecified: Secondary | ICD-10-CM

## 2018-10-26 MED ORDER — AMOXICILLIN 875 MG PO TABS: 875.0000 mg | ORAL_TABLET | Freq: Two times a day (BID) | ORAL | 0 refills | Status: DC

## 2018-10-26 NOTE — Progress Notes (Signed)
Virtual Visit via Video   I connected with Brittany Levine  Brittany Levine on 10/26/18 at  8:00 AM EDT by a video enabled telemedicine application and verified that I am speaking with the correct person using two identifiers. Location patient: Home Location provider: [facility], Office Persons participating in the virtual visit: Brittany Levine  Brittany Levine, Ziggy Chanthavong PA-C, Brittany Levine  I discussed the limitations of evaluation and management by telemedicine and the availability of in person appointments. The patient expressed understanding and agreed to proceed.  I acted as a Neurosurgeonscribe for Energy East CorporationSamantha Crist Levine, Avon ProductsPA-C Brittany Orphanos, LPN  Subjective:   HPI:  Otalgia Pt c/o constant dull pain right ear x 2 weeks. Denies drainage. Pt said she can hear her heartbeat in her ear. She is having some chills off and on but no fever. Has not tried any medication. No swimming recently. Pain is 3/10. Feels like something is clogged in her ear. Does have nasal drainage. Doesn't like to do nasal sprays.  VSD Pt would like a referral to Cardiology due to hx of VSD.  She states that in the past she has had to use a Holter monitor because she was having sensation of skipped beats around age [age].  These episodes happen intermittently, usually last about 1 second, however since she has moved here she has not had any cardiology follow-up.  And now she is having the sensations during activity specifically, had 1 episode that lasted a little bit longer this morning (5 sec) while working out.  They are usually related to caffeine but she is drinking as little caffeine as she has ever has.  Denies chest pain, shortness of breath, left arm pain, lower leg swelling.     ROS: See pertinent positives and negatives per HPI.  Patient Active Problem List   Diagnosis Date Noted  . Orthostatic dizziness 07/26/2018    Social History   Tobacco Use  . Smoking status: Never Smoker  . Smokeless tobacco: Never Used  Substance Use  Topics  . Alcohol use: Yes    Current Outpatient Medications:  .  Prenatal Vit-Fe Fumarate-FA (PRENATAL MULTIVITAMIN) TABS tablet, Take 1 tablet by mouth daily at 12 noon., Disp: , Rfl:   No Known Allergies  Objective:   VITALS: Per patient if applicable, see vitals. GENERAL: Alert, appears well and in no acute distress. HEENT: Atraumatic, conjunctiva clear, no obvious abnormalities on inspection of external nose and ears. NECK: Normal movements of the head and neck. CARDIOPULMONARY: No increased WOB. Speaking in clear sentences. I:E ratio WNL.  MS: Moves all visible extremities without noticeable abnormality. PSYCH: Pleasant and cooperative, well-groomed. Speech normal rate and rhythm. Affect is appropriate. Insight and judgement are appropriate. Attention is focused, linear, and appropriate.  NEURO: CN grossly intact. Oriented as arrived to appointment on time with no prompting. Moves both UE equally.  SKIN: No obvious lesions, wounds, erythema, or cyanosis noted on face or hands.  Assessment and Plan:   Brittany Levine  was seen today for otalgia.  Diagnoses and all orders for this visit:  Right ear pain No red flags on discussion with patient.  Based on her history, I have told her that she could likely have an ear infection, eustachian tube dysfunction, or even cerumen impaction.  For now we are going to conservatively treat with amoxicillin and have her try some nasal lavage.  I would avoid Sudafed as she is currently having sensation of intermittent skipped heartbeats.  I also said that she could consider  taking some Mucinex to thin out her potential nasal discharge.  We did go and make an appointment in the office on Monday for her to be evaluated in case her symptoms do not improve.  Worsening precautions advised.  VSD (ventricular septal defect); Skipped heart beats Cardiology referral.  She knows to not work out when she has these symptoms, and I discussed with her to avoid any  strenuous activity until she sees cardiology for further work-up since this is potentially a new trigger of her symptoms.  Worsening precautions advised, and I did instruct her to not delay care due to COVID-19.   Brittany Levine Reviewed expectations re: course of current medical issues. . Discussed self-management of symptoms. . Outlined signs and symptoms indicating need for more acute intervention. . Patient verbalized understanding and all questions were answered. Brittany Levine Health Maintenance issues including appropriate healthy diet, exercise, and smoking avoidance were discussed with patient. . See orders for this visit as documented in the electronic medical record.  I discussed the assessment and treatment plan with the patient. The patient was provided an opportunity to ask questions and all were answered. The patient agreed with the plan and demonstrated an understanding of the instructions.   The patient was advised to call back or seek an in-person evaluation if the symptoms worsen or if the condition fails to improve as anticipated.   CMA or LPN served as scribe during this visit. History, Physical, and Plan performed by medical provider. The above documentation has been reviewed and is accurate and complete.   [place], [place] 10/26/2018

## 2018-10-30 ENCOUNTER — Ambulatory Visit (INDEPENDENT_AMBULATORY_CARE_PROVIDER_SITE_OTHER): Payer: Managed Care, Other (non HMO) | Admitting: Physician Assistant

## 2018-10-30 ENCOUNTER — Other Ambulatory Visit: Payer: Self-pay

## 2018-10-30 ENCOUNTER — Encounter: Payer: Self-pay | Admitting: Physician Assistant

## 2018-10-30 VITALS — BP 120/76 | HR 50 | Temp 98.2°F | Ht 67.0 in | Wt 184.0 lb

## 2018-10-30 DIAGNOSIS — H9201 Otalgia, right ear: Secondary | ICD-10-CM | POA: Diagnosis not present

## 2018-10-30 NOTE — Progress Notes (Signed)
Cardiology Office Note:   Date:  10/31/2018  NAME:  Brittany Levine    MRN: [id number] DOB:  [birth year]   PCP:  Inda Coke, PA  Cardiologist:  No primary care provider on file.  Electrophysiologist:  None   Referring MD: Inda Coke, PA   Chief Complaint  Patient presents with   New Patient (Initial Visit)   Headache   Chest Pain    Discomfort.   History of Present Illness:   Brittany Levine is a 33 y.o. female with a hx of VSD?  Who is being seen today for the evaluation of palpitations at the request of Inda Coke, MD. she reports 2 weeks of worsening palpitations with exercise.  She reports they occur 5-6 times daily, are worse with exercise, and alleviated by no identifiable factor.  She states she has a long history of the sensation of "missed heartbeats".  She reports they have become a bit more profound and more frequent over the past few weeks.  She does endorse frequent anxiety with a coronavirus pandemic.  She states she has been followed by cardiologist in [place], who is attributed her palpitations to excess caffeine consumption.  She reports she has been rather aggressive about caffeine reduction since then.  Despite this reduction in caffeine her symptoms seem to have recurred.  Associated symptoms also include nearly 4 months of what she describes as constant chest tightness.  She states the pain in her chest occurs most days and last with her until she falls asleep.  She reports no worsening of her symptoms with exertion, and no alleviation of her symptoms with cessation of activity.  She denies any syncope with the palpitations or chest pain.  She does Milli states she is concerned.  Of note, she has a history of a VSD that was diagnosed in childhood.  She states she had a right and left heart catheterization, but has had no intervention on this.  She reports she had an echocardiogram some number of years ago in [place] and was told everything looked  okay.  She is a non-smoker, and has no chronic medical problems.  Most recent lab work from primary care physician demonstrates normal creatinine.  However no TSH was obtained.  Overall, she reports a high level of stress in her life.  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  She states she is concerned about the coronavirus pandemic in her heart condition.  Past Medical History: Past Medical History:  Diagnosis Date   VSD (ventricular septal defect)     Past Surgical History: Past Surgical History:  Procedure Laterality Date   CESAREAN SECTION  2013   RIGHT AND LEFT HEART CATH  1989    Current Medications: Current Meds  Medication Sig   amoxicillin (AMOXIL) 875 MG tablet Take 1 tablet (875 mg total) by mouth 2 (two) times daily.   Prenatal Vit-Fe Fumarate-FA (PRENATAL MULTIVITAMIN) TABS tablet Take 1 tablet by mouth daily at 12 noon.     Allergies:    Patient has no known allergies.   Social History: Social History   Socioeconomic History   Marital status: Married    Spouse name: Not on file   Number of children: 1   Years of education: Not on file   Highest education level: Not on file  Occupational History   Not on file  Social Needs   Financial resource strain: Not on file   Food insecurity    Worry:  Not on file    Inability: Not on file   Transportation needs    Medical: Not on file    Non-medical: Not on file  Tobacco Use   Smoking status: Never Smoker   Smokeless tobacco: Never Used  Substance and Sexual Activity   Alcohol use: Yes   Drug use: Never   Sexual activity: Yes    Birth control/protection: None  Lifestyle   Physical activity    Days per week: Not on file    Minutes per session: Not on file   Stress: Not on file  Relationships   Social connections    Talks on phone: Not on file    Gets together: Not on file    Attends religious service: Not on file    Active member of club or organization: Not on  file    Attends meetings of clubs or organizations: Not on file    Relationship status: Not on file  Other Topics Concern   Not on file  Social History Narrative   From [place]   Works in IT   Has 33 y/o dtr   Married     Family History: The patient's family history includes Birth defects in her mother; Diabetes in her maternal grandmother; Heart disease in her maternal grandmother; Hypertension in her maternal grandmother; Miscarriages / [place] in her mother; Prostate cancer in her maternal grandfather.  ROS:   All other ROS reviewed and negative. Pertinent positives noted in the HPI.     EKGs/Labs/Other Studies Reviewed:   The following studies were personally reviewed by me today:   EKG:  EKG is ordered today.  The ekg ordered today demonstrates normal sinus rhythm heart rate 62, normal intervals, nonspecific ST-T changes noted no acute ischemic changes or prior partial, and was personally reviewed by me.   Recent Labs: 07/10/2018: ALT 19; BUN 8; Creatinine, Ser 0.73; Hemoglobin 13.4; Platelets 182.0; Potassium 4.2; Sodium 136   Recent Lipid Panel No results found for: CHOL, TRIG, HDL, CHOLHDL, VLDL, LDLCALC, LDLDIRECT  Physical Exam:   VS:  BP 128/80 (BP Location: Left Arm, Patient Position: Sitting, Cuff Size: Normal)    Pulse 62    Temp 98.2 F (36.8 C)    Ht 5\' 7"  (1.702 m)    Wt 183 lb (83 kg)    LMP 10/21/2018    BMI 28.66 kg/m    Wt Readings from Last 3 Encounters:  10/31/18 183 lb (83 kg)  10/30/18 184 lb (83.5 kg)  10/26/18 180 lb (81.6 kg)    General: Well nourished, well developed, in no acute distress Heart: Atraumatic, normal size  Eyes: PEERLA, EOMI  Neck: Supple, no JVD Endocrine: No thryomegaly Cardiac: Normal S1, S2; RRR; no murmurs, rubs, or gallops Lungs: Clear to auscultation bilaterally, no wheezing, rhonchi or rales  Abd: Soft, nontender, no hepatomegaly  Ext: No edema, pulses 2+ Musculoskeletal: No deformities, BUE and BLE strength normal  and equal Skin: Warm and dry, no rashes   Neuro: Alert and oriented to person, place, time, and situation, CNII-XII grossly intact, no focal deficits  Psych: Normal mood and affect   ASSESSMENT:   NAME@ is a 33 y.o. female who presents for the following: 1. Palpitations   2. Chest pain of uncertain etiology   3. VSD (ventricular septal defect)     PLAN:   1. Palpitations 2. Chest pain of uncertain etiology -Overall I suspect her sensation of skipped heartbeats/palpitations is related to stress.  However given her  history of congenital heart disease, it is reasonable to proceed with a 7-day zio patch.  Her chest pain described is noncardiac and likely also related stress.  I counseled her on appropriate stress reduction activities.  She is safe to resume regular exercise.  I will obtain an echocardiogram since she is relocated here from [place], and this will be needed as a baseline for her moving forward.   3. VSD (ventricular septal defect) -We will obtain an echocardiogram.  She has no stigmata of residual shunting, and no evidence of LV volume overload that I can tell on exam.  She also has no evidence of a murmur.  I suspect this will be a small muscular VSD of no hemodynamic consequence.   Disposition: Return in about 8 weeks (around 12/26/2018).  Medication Adjustments/Labs and Tests Ordered: Current medicines are reviewed at length with the patient today.  Concerns regarding medicines are outlined above.  Orders Placed This Encounter  Procedures   LONG TERM MONITOR (3-14 DAYS)   ECHOCARDIOGRAM COMPLETE   No orders of the defined types were placed in this encounter.   Patient Instructions  Medication Instructions:   No changes  Lab work:  Not needed   Testing/Procedures:  Will be  Schedule at [facility] Your physician has requested that you have an echocardiogram. Echocardiography is a painless test that uses sound waves to create images of  your heart. It provides your doctor with information about the size and shape of your heart and how well your hearts chambers and valves are working. This procedure takes approximately one hour. There are no restrictions for this procedure.  AND  Your physician has recommended that you wear a 7 DAY ZIO-PATCH monitor. The Zio patch cardiac monitor continuously records heart rhythm data for up to 14 days, this is for patients being evaluated for multiple types heart rhythms. For the first 24 hours post application, please avoid getting the Zio monitor wet in the shower or by excessive sweating during exercise. After that, feel free to carry on with regular activities. Keep soaps and lotions away from the ZIO XT Patch.  This [facility] placed at [facility] location - [address], [address].         Follow-Up: At [facility], you and your health needs are our priority.  As part of our continuing mission to provide you with exceptional heart care, we have created designated Provider Care Teams.  These Care Teams include your primary Cardiologist (physician) and Advanced Practice Providers (APPs -  Physician Assistants and Nurse Practitioners) who all work together to provide you with the care you need, when you need it.  Dr Flora Lipps'Neal recommends that you schedule a follow-up appointment in 8 WEEKS      Any Other Special Instructions Will Be Listed Below. N/A    Signed, Gerri SporeWesley T. Flora Lipps'Neal, MD [facility]   [facility]  [address], [address] [place], [place] [address] [phone]  10/31/2018 2:44 PM

## 2018-10-30 NOTE — Progress Notes (Signed)
Brittany MallingLesli  Ann Levine is a 33 y.o. female is here to follow up on right ear pain.  I acted as a Neurosurgeonscribe for Energy East CorporationSamantha Aboubacar Matsuo, PA-C [name], LPN  History of Present Illness:   Chief Complaint  Patient presents with  . Otalgia    HPI   Otalgia Pt c/o right ear pain x 2.5 weeks. Pt had virtual visit on 8/20 and was prescribed Amoxicillin 875 mg BID and is currently still taking.  Her symptoms feel improved except for the sensation of feeling like something is in her ear, such as fluid.  She does not denies any new symptoms.  She office visit on 820 for more information.  Health Maintenance Due  Topic Date Due  . HIV Screening  10/26/2000  . PAP SMEAR-Modifier  10/27/2006    Past Medical History:  Diagnosis Date  . VSD (ventricular septal defect)      Social History   Socioeconomic History  . Marital status: Married    Spouse name: Not on file  . Number of children: 1  . Years of education: Not on file  . Highest education level: Not on file  Occupational History  . Not on file  Social Needs  . Financial resource strain: Not on file  . Food insecurity    Worry: Not on file    Inability: Not on file  . Transportation needs    Medical: Not on file    Non-medical: Not on file  Tobacco Use  . Smoking status: Never Smoker  . Smokeless tobacco: Never Used  Substance and Sexual Activity  . Alcohol use: Yes  . Drug use: Never  . Sexual activity: Yes    Birth control/protection: None  Lifestyle  . Physical activity    Days per week: Not on file    Minutes per session: Not on file  . Stress: Not on file  Relationships  . Social Musicianconnections    Talks on phone: Not on file    Gets together: Not on file    Attends religious service: Not on file    Active member of club or organization: Not on file    Attends meetings of clubs or organizations: Not on file    Relationship status: Not on file  . Intimate partner violence    Fear of current or ex partner: Not  on file    Emotionally abused: Not on file    Physically abused: Not on file    Forced sexual activity: Not on file  Other Topics Concern  . 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 CESAREAN SECTION  2013  . RIGHT AND LEFT HEART CATH  1989    Family History  Problem Relation Age of Onset  . Prostate cancer Maternal Grandfather   . Birth defects Mother   . Miscarriages / [place] Mother   . Heart disease Maternal Grandmother   . Hypertension Maternal Grandmother   . Diabetes Maternal Grandmother     PMHx, SurgHx, SocialHx, FamHx, Medications, and Allergies were reviewed in the Visit Navigator and updated as appropriate.   Patient Active Problem List   Diagnosis Date Noted  . Orthostatic dizziness 07/26/2018    Social History   Tobacco Use  . Smoking status: Never Smoker  . Smokeless tobacco: Never Used  Substance Use Topics  .  Alcohol use: Yes  . Drug use: Never    Current Medications and Allergies:    Current Outpatient Medications:  .  amoxicillin (AMOXIL) 875 MG tablet, Take 1 tablet (875 mg total) by mouth 2 (two) times daily., Disp: 20 tablet, Rfl: 0 .  Prenatal Vit-Fe Fumarate-FA (PRENATAL MULTIVITAMIN) TABS tablet, Take 1 tablet by mouth daily at 12 noon., Disp: , Rfl:   No Known Allergies  Review of Systems   ROS Negative unless otherwise specified per HPI.  Vitals:   Vitals:   10/30/18 1129  BP: 120/76  Pulse: (!) 50  Temp: 98.2 F (36.8 C)  TempSrc: Temporal  SpO2: 97%  Weight: 184 lb (83.5 kg)  Height: 5\' 7"  (1.702 m)     Body mass index is 28.82 kg/m.   Physical Exam:    Physical Exam Vitals signs and nursing note reviewed.  Constitutional:      General: She is not in acute distress.    Appearance: She is well-developed. She is not ill-appearing or toxic-appearing.  HENT:     Head: Normocephalic and  atraumatic.     Right Ear: Ear canal and external ear normal. A middle ear effusion is present. Tympanic membrane is not erythematous, retracted or bulging.     Left Ear: Tympanic membrane, ear canal and external ear normal. Tympanic membrane is not erythematous, retracted or bulging.     Nose: Nose normal.     Right Sinus: No maxillary sinus tenderness or frontal sinus tenderness.     Left Sinus: No maxillary sinus tenderness or frontal sinus tenderness.     Mouth/Throat:     Pharynx: Uvula midline. No oropharyngeal exudate or posterior oropharyngeal erythema.  Cardiovascular:     Rate and Rhythm: Normal rate and regular rhythm.     Heart sounds: Normal heart sounds.  Pulmonary:     Effort: Pulmonary effort is normal. No accessory muscle usage or respiratory distress.     Breath sounds: Normal breath sounds.  Lymphadenopathy:     Cervical: No cervical adenopathy.  Skin:    General: Skin is warm and dry.  Neurological:     Mental Status: She is alert.  Psychiatric:        Behavior: Behavior is cooperative.      Assessment and Plan:    Brittany Levine  was seen today for otalgia.  Diagnoses and all orders for this visit:  Right ear pain   Improving.  I do think she has a component of eustachian tube dysfunction as well as a resolving ear infection.  Recommend she complete the oral antibiotic amoxicillin.  Start daily oral antihistamine and Flonase once in a.m. and p.m.  Follow-up if symptoms worsen or do not improve.  . Reviewed expectations re: course of current medical issues. . Discussed self-management of symptoms. . Outlined signs and symptoms indicating need for more acute intervention. . Patient verbalized understanding and all questions were answered. . See orders for this visit as documented in the electronic medical record. . Patient received an After Visit Summary.  CMA or LPN served as scribe during this visit. History, Physical, and Plan performed by medical provider.  The above documentation has been reviewed and is accurate and complete.   Inda Coke, PA-C [place] [place] 10/30/2018  Follow-up: No follow-ups on file.

## 2018-10-31 ENCOUNTER — Ambulatory Visit (INDEPENDENT_AMBULATORY_CARE_PROVIDER_SITE_OTHER): Payer: Managed Care, Other (non HMO) | Admitting: Cardiovascular Disease

## 2018-10-31 ENCOUNTER — Telehealth: Payer: Self-pay | Admitting: Radiology

## 2018-10-31 ENCOUNTER — Encounter: Payer: Self-pay | Admitting: Cardiovascular Disease

## 2018-10-31 VITALS — BP 128/80 | HR 62 | Temp 98.2°F | Ht 67.0 in | Wt 183.0 lb

## 2018-10-31 DIAGNOSIS — R002 Palpitations: Secondary | ICD-10-CM | POA: Diagnosis not present

## 2018-10-31 DIAGNOSIS — Q210 Ventricular septal defect: Secondary | ICD-10-CM | POA: Diagnosis not present

## 2018-10-31 DIAGNOSIS — R079 Chest pain, unspecified: Secondary | ICD-10-CM

## 2018-10-31 DIAGNOSIS — R0789 Other chest pain: Secondary | ICD-10-CM | POA: Diagnosis not present

## 2018-10-31 NOTE — Addendum Note (Signed)
Addended by: Raiford Simmonds on: 10/31/2018 02:50 PM   Modules accepted: Orders

## 2018-10-31 NOTE — Telephone Encounter (Signed)
Enrolled patient for a 7 Day Zio monitor to be mailed Brief instructions were gone over with the patient and she knows to expect the monitor to arrive in 3-4 days 

## 2018-10-31 NOTE — Patient Instructions (Addendum)
Medication Instructions:   No changes  Lab work:  TSH -TODAY   Testing/Procedures:  Will be  Schedule at [facility] has requested that you have an echocardiogram. Echocardiography is a painless test that uses sound waves to create images of your heart. It provides your doctor with information about the size and shape of your heart and how well your heart's chambers and valves are working. This procedure takes approximately one hour. There are no restrictions for this procedure.  AND  Your physician has recommended that you wear a 7 DAY ZIO-PATCH monitor. The Zio patch cardiac monitor continuously records heart rhythm data for up to 14 days, this is for patients being evaluated for multiple types heart rhythms. For the first 24 hours post application, please avoid getting the Zio monitor wet in the shower or by excessive sweating during exercise. After that, feel free to carry on with regular activities. Keep soaps and lotions away from the ZIO XT Patch.  This will be placed at our [facility] location - [address].         Follow-Up: At [facility], you and your health needs are our priority.  As part of our continuing mission to provide you with exceptional heart care, we have created designated Provider Care Teams.  These Care Teams include your primary Cardiologist (physician) and Advanced Practice Providers (APPs -  Physician Assistants and Nurse Practitioners) who all work together to provide you with the care you need, when you need it. . Dr Audie Box recommends that you schedule a follow-up appointment in 8 WEEKS      Any Other Special Instructions Will Be Listed Below. N/A

## 2018-11-01 LAB — TSH: TSH: 0.732 u[IU]/mL (ref 0.450–4.500)

## 2018-11-02 ENCOUNTER — Ambulatory Visit (HOSPITAL_COMMUNITY): Payer: Managed Care, Other (non HMO) | Attending: Cardiology

## 2018-11-02 ENCOUNTER — Other Ambulatory Visit (INDEPENDENT_AMBULATORY_CARE_PROVIDER_SITE_OTHER): Payer: Managed Care, Other (non HMO)

## 2018-11-02 ENCOUNTER — Other Ambulatory Visit: Payer: Self-pay

## 2018-11-02 DIAGNOSIS — R002 Palpitations: Secondary | ICD-10-CM

## 2018-11-02 DIAGNOSIS — Q210 Ventricular septal defect: Secondary | ICD-10-CM

## 2018-11-02 DIAGNOSIS — R079 Chest pain, unspecified: Secondary | ICD-10-CM

## 2018-11-02 DIAGNOSIS — R0789 Other chest pain: Secondary | ICD-10-CM | POA: Insufficient documentation

## 2018-11-04 ENCOUNTER — Ambulatory Visit (INDEPENDENT_AMBULATORY_CARE_PROVIDER_SITE_OTHER): Payer: Managed Care, Other (non HMO)

## 2018-11-04 DIAGNOSIS — Q210 Ventricular septal defect: Secondary | ICD-10-CM

## 2018-11-04 DIAGNOSIS — R002 Palpitations: Secondary | ICD-10-CM | POA: Diagnosis not present

## 2018-11-04 DIAGNOSIS — R079 Chest pain, unspecified: Secondary | ICD-10-CM

## 2018-11-04 DIAGNOSIS — R0789 Other chest pain: Secondary | ICD-10-CM | POA: Diagnosis not present

## 2018-11-27 ENCOUNTER — Other Ambulatory Visit: Payer: Self-pay

## 2018-12-27 NOTE — Progress Notes (Signed)
Cardiology Office Note:   Date:  12/28/2018  NAME:  Brittany Levine  Brittany Levine    MRN: [id number] DOB:  06/20/1985   PCP:  Jarold MottoWorley, Samantha, PA  Cardiologist:  Reatha HarpsWesley T O'Neal, MD   Referring MD: Jarold MottoWorley, Samantha, [place]   Chief Complaint  Patient presents with  . Palpitations   History of Present Illness:   Brittany Levine  Brittany Levine is a 33 y.o. female with a hx of VSD who presents for follow-up of palpitations. Recent echocardiogram with normal LVEF, normal strain, and small restrictive, membranous VSD.  She reports she still gets sensations of palpitations with exercise.  Her recent ZIO patch showed no episodes of sustained arrhythmias or anything to explain her symptoms.  Irving Burtonmily showed sinus tachycardia, and episodes of lightheadedness with lifting heavy objects.  She had sinus tachycardia at times but no sustained arrhythmia that I can tell.  She has continued exercise and has lost a little bit of weight.  I instructed her that this is okay for her to continue.  She does not need dental prophylaxis.  She is doing well and has no real medical problems.  She reports she is getting ready [place] [facility], and I wished her the best.  Past Medical History: Past Medical History:  Diagnosis Date  . VSD (ventricular septal defect)     Past Surgical History: Past Surgical History:  Procedure Laterality Date  . CESAREAN SECTION  2013  . RIGHT AND LEFT HEART CATH  1989    Current Medications: Current Meds  Medication Sig  . Prenatal Vit-Fe Fumarate-FA (PRENATAL MULTIVITAMIN) TABS tablet Take 1 tablet by mouth daily at 12 noon.     Allergies:    Patient has no known allergies.   Social History: Social History   Socioeconomic History  . Marital status: Married    Spouse name: Not on file  . Number of children: 1  . Years of education: Not on file  . Highest education level: Not on file  Occupational History  . Not on file  Social Needs  . Financial resource strain: Not on file   . Food insecurity    Worry: Not on file    Inability: Not on file  . Transportation needs    Medical: Not on file    Non-medical: Not on file  Tobacco Use  . Smoking status: Never Smoker  . Smokeless tobacco: Never Used  Substance and Sexual Activity  . Alcohol use: Yes  . Drug use: Never  . Sexual activity: Yes    Birth control/protection: None  Lifestyle  . Physical activity    Days per week: Not on file    Minutes per session: Not on file  . Stress: Not on file  Relationships  . Social Musicianconnections    Talks on phone: Not on file    Gets together: Not on file    Attends religious service: Not on file    Active member of club or organization: Not on file    Attends meetings of clubs or organizations: Not on file    Relationship status: Not on file  Other Topics Concern  . Not on file  Social History Narrative   [place]   Works in IT   Has 33 y/o dtr   Married     Family History: The patient's family history includes Birth defects in her mother; Diabetes in her maternal grandmother; Heart disease in her maternal grandmother; Hypertension in her maternal grandmother; Miscarriages /  Stillbirths in her mother; Prostate cancer in her maternal grandfather.  ROS:   All other ROS reviewed and negative. Pertinent positives noted in the HPI.     EKGs/Labs/Other Studies Reviewed:   The following studies were personally reviewed by me today:  7-day Zio 11/27/2018 Impression: 1. No arrhythmias to explain symptoms.  2. Symptoms reported during exercise but sinus tachycardia noted during these episodes which is normal/expected.   TTE 11/02/2018 1. The average left ventricular global longitudinal strain is -24.5 %.  2. The left ventricle has normal systolic function with an ejection fraction of 60-65%. The cavity size was normal. Left ventricular diastolic parameters were normal. No evidence of left ventricular regional wall motion abnormalities.  3. Small membranous  ventricular septal defect with bidirectional shunting by colorflow doppler with a peak gradient of .  4. The right ventricle has normal systolic function. The cavity was normal. There is no increase in right ventricular wall thickness.  5. Left atrial size was mildly dilated.  6. The aorta is normal unless otherwise noted.  Recent Labs: 07/10/2018: ALT 19; BUN 8; Creatinine, Ser 0.73; Hemoglobin 13.4; Platelets 182.0; Potassium 4.2; Sodium 136 10/31/2018: TSH 0.732   Recent Lipid Panel No results found for: CHOL, TRIG, HDL, CHOLHDL, VLDL, LDLCALC, LDLDIRECT  Physical Exam:   VS:  BP 120/60   Pulse 60   Ht 5\' 7"  (1.702 m)   Wt 182 lb 12.8 oz (82.9 kg)   BMI 28.63 kg/m    Wt Readings from Last 3 Encounters:  12/28/18 182 lb 12.8 oz (82.9 kg)  10/31/18 183 lb (83 kg)  10/30/18 184 lb (83.5 kg)    General: Well nourished, well developed, in no acute distress Heart: Atraumatic, normal size  Eyes: PEERLA, EOMI  Neck: Supple, no JVD Endocrine: No thryomegaly Cardiac: 3/6 holosystolic murmur best heard at left lower sternal border Lungs: Clear to auscultation bilaterally, no wheezing, rhonchi or rales  Abd: Soft, nontender, no hepatomegaly  Ext: No edema, pulses 2+ Musculoskeletal: No deformities, BUE and BLE strength normal and equal Skin: Warm and dry, no rashes   Neuro: Alert and oriented to person, place, time, and situation, CNII-XII grossly intact, no focal deficits  Psych: Normal mood and affect   ASSESSMENT:   Brittany Levine is a 33 y.o. female who presents for the following: 1. Palpitations   2. VSD (ventricular septal defect)     PLAN:   1. Palpitations -Recent Zio patch showed just sinus rhythm and sinus tachycardia with exercise.  She did have symptoms of palpitations that mainly coincide with normal sinus rhythm.  There was no evidence of any arrhythmias to explain her symptoms. -I instructed her that is okay to exercise and she will continue to do so.  I  also questioned if there is an asthma component to this she reports shortness of breath.  She does have an inhaler and will attempt to use to see if her symptoms improve.  2. VSD (ventricular septal defect) -Small membranous restrictive VSD seen on echocardiogram.  Normal LV function.  I told her this will likely never be a problem for her.  She does not even need dental prophylaxis. -She is planning to relocate back to [facility] and she will establish with a cardiologist there  Disposition: Return if symptoms worsen or fail to improve.  Medication Adjustments/Labs and Tests Ordered: Current medicines are reviewed at length with the patient today.  Concerns regarding medicines are outlined above.  No orders of the defined  types were placed in this encounter.  No orders of the defined types were placed in this encounter.   Patient Instructions  Medication Instructions:  NO CHANGE *If you need a refill on your cardiac medications before your next appointment, please call your pharmacy*  Lab Work: If you have labs (blood work) drawn today and your tests are completely normal, you will receive your results only by: Marland Kitchen MyChart Message (if you have MyChart) OR . A paper copy in the mail If you have any lab test that is abnormal or we need to change your treatment, we will call you to review the results.  Follow-Up: At [facility], you and your health needs are our priority.  As part of our continuing mission to provide you with exceptional heart care, we have created designated Provider Care Teams.  These Care Teams include your primary Cardiologist (physician) and Advanced Practice Providers (APPs -  Physician Assistants and Nurse Practitioners) who all work together to provide you with the care you need, when you need it.  Your next appointment:   AS NEEDED  The format for your next appointment:   In Person  Provider:   Eleonore Chiquito, MD      Signed, Addison Naegeli. Audie Box, [place]  [address] [place], [place] [address] [phone]  12/28/2018 1:32 PM

## 2018-12-28 ENCOUNTER — Encounter: Payer: Self-pay | Admitting: Cardiovascular Disease

## 2018-12-28 ENCOUNTER — Ambulatory Visit (INDEPENDENT_AMBULATORY_CARE_PROVIDER_SITE_OTHER): Payer: Managed Care, Other (non HMO) | Admitting: Cardiovascular Disease

## 2018-12-28 ENCOUNTER — Other Ambulatory Visit: Payer: Self-pay

## 2018-12-28 VITALS — BP 120/60 | HR 60 | Ht 67.0 in | Wt 182.8 lb

## 2018-12-28 DIAGNOSIS — R002 Palpitations: Secondary | ICD-10-CM | POA: Diagnosis not present

## 2018-12-28 DIAGNOSIS — Q210 Ventricular septal defect: Secondary | ICD-10-CM

## 2018-12-28 NOTE — Patient Instructions (Signed)
Medication Instructions:  NO CHANGE *If you need a refill on your cardiac medications before your next appointment, please call your pharmacy*  Lab Work: If you have labs (blood work) drawn today and your tests are completely normal, you will receive your results only by: Marland Kitchen MyChart Message (if you have MyChart) OR . A paper copy in the mail If you have any lab test that is abnormal or we need to change your treatment, we will call you to review the results.  Follow-Up: At [facility], you and your health needs are our priority.  As part of our continuing mission to provide you with exceptional heart care, we have created designated Provider Care Teams.  These Care Teams include your primary Cardiologist (physician) and Advanced Practice Providers (APPs -  Physician Assistants and Nurse Practitioners) who all work together to provide you with the care you need, when you need it.  Your next appointment:   AS NEEDED  The format for your next appointment:   In Person  Provider:   Eleonore Chiquito, MD

## 2019-01-01 ENCOUNTER — Ambulatory Visit: Payer: Managed Care, Other (non HMO) | Admitting: Cardiovascular Disease

## 2021-03-04 IMAGING — CT CT RENAL STONE PROTOCOL
2 of 4 series · 17 of 46 positions shown, 19 images · non-contrast
Comparison: None.

CLINICAL DATA: Right-sided flank pain

EXAM:
CT ABDOMEN AND PELVIS WITHOUT CONTRAST
TECHNIQUE: Multidetector CT imaging of the abdomen and pelvis was performed
following the standard protocol without IV contrast.

[Series 2: stone study 5.0 i30f 1 · axial · 0.84mm/px · z∈[+978,+1363]mm · 14 of 85 slices shown, 16 images]
[im 4/85  soft-tissue]
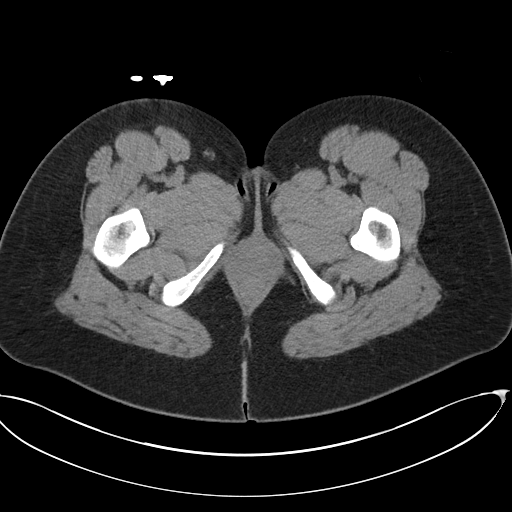
[im 4/85  bone]
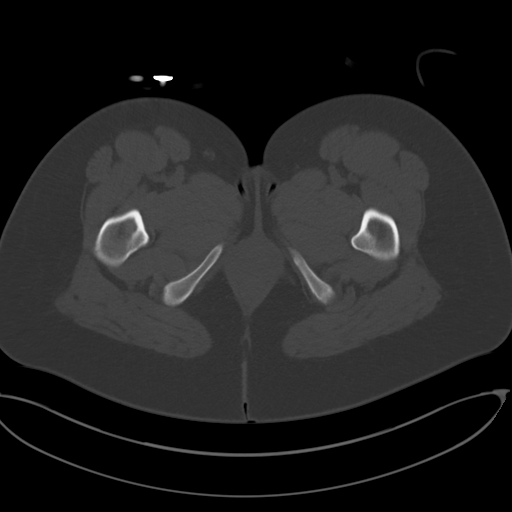
[im 11/85  soft-tissue]
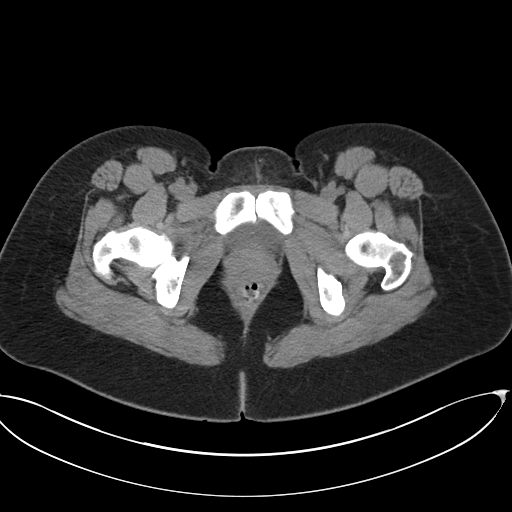
[im 15/85  soft-tissue]
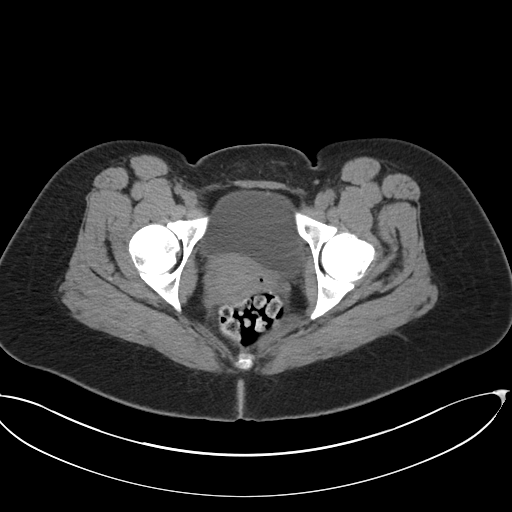
[im 22/85  soft-tissue]
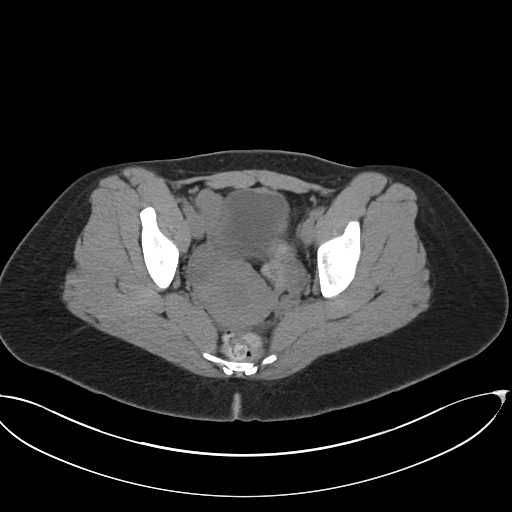
[im 30/85  soft-tissue]
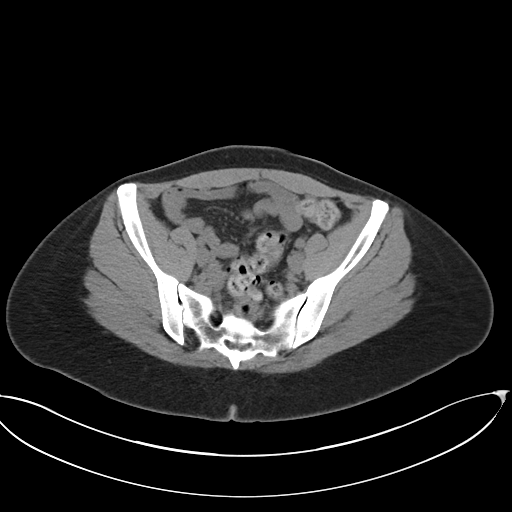
[im 33/85  soft-tissue]
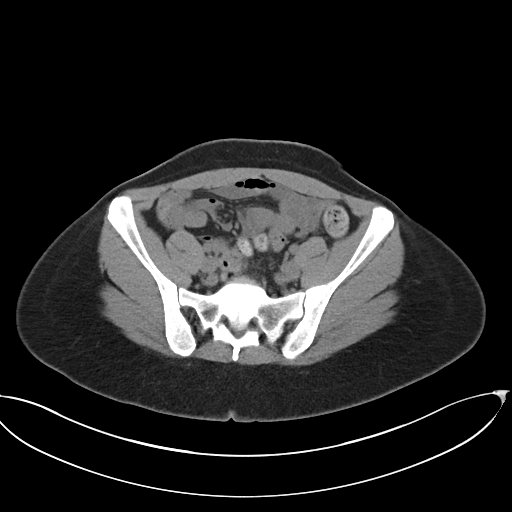
[im 41/85  soft-tissue]
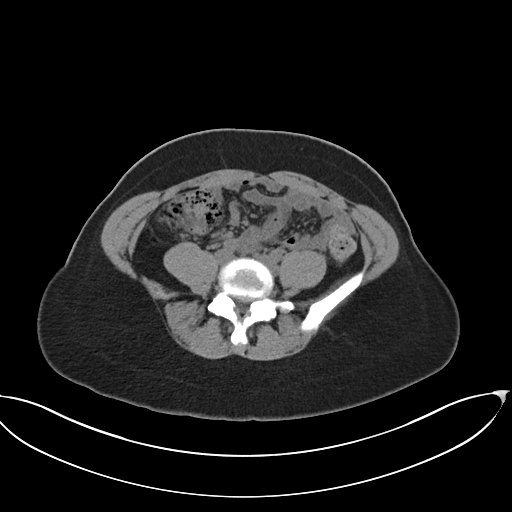
[im 44/85  soft-tissue]
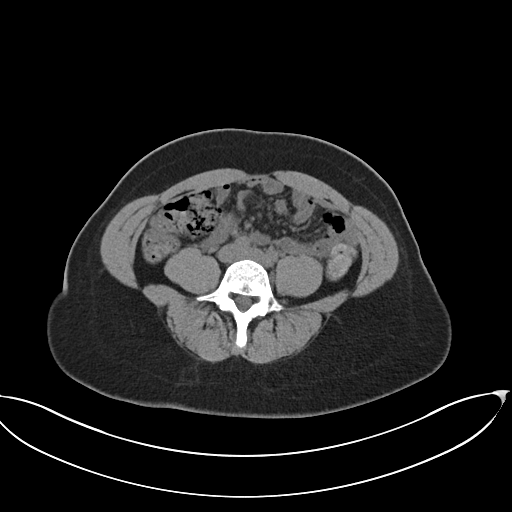
[im 52/85  soft-tissue]
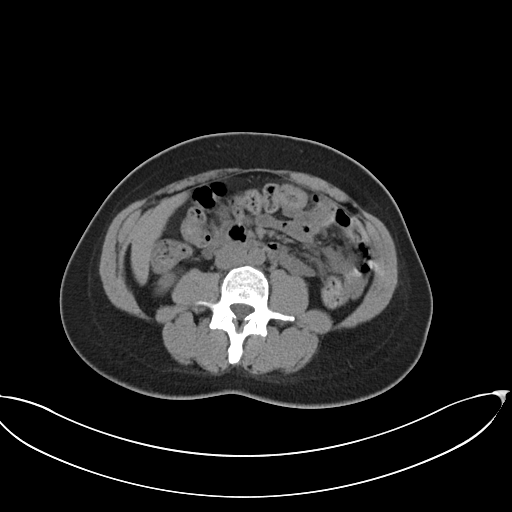
[im 52/85  bone]
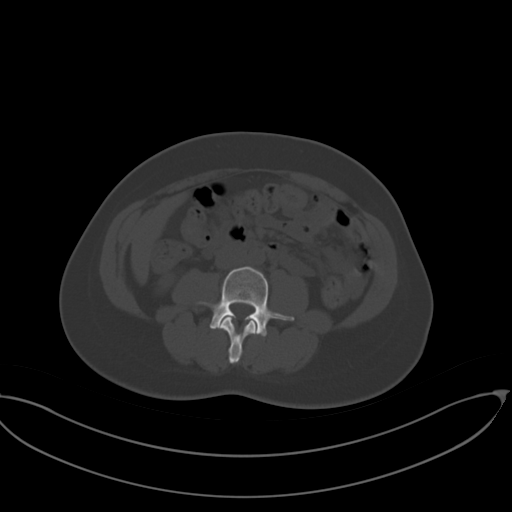
[im 55/85  soft-tissue]
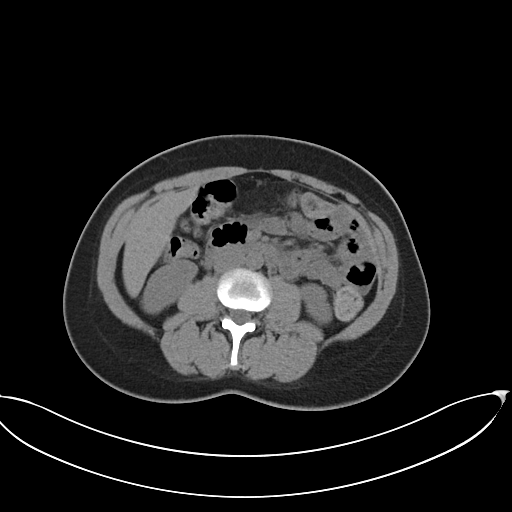
[im 63/85  soft-tissue]
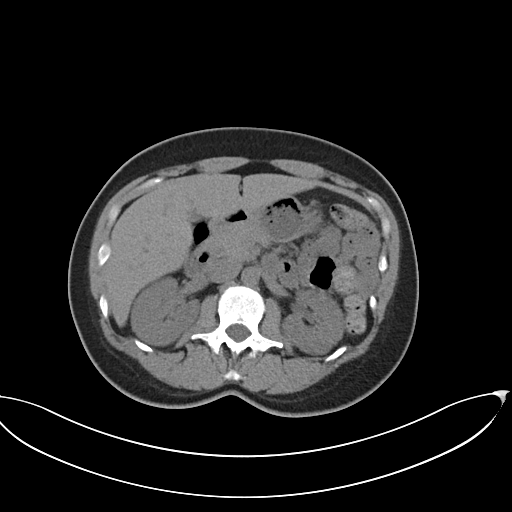
[im 70/85  soft-tissue]
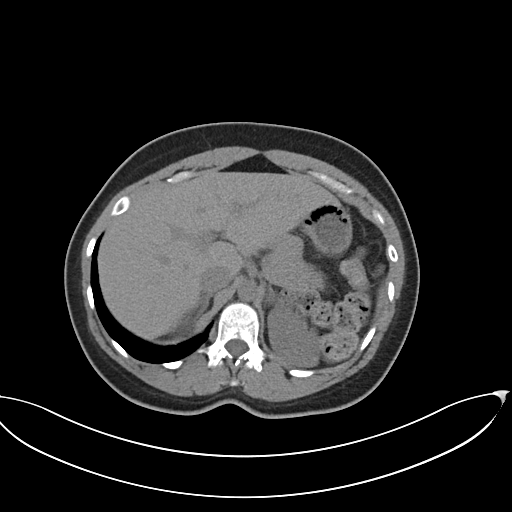
[im 74/85  soft-tissue]
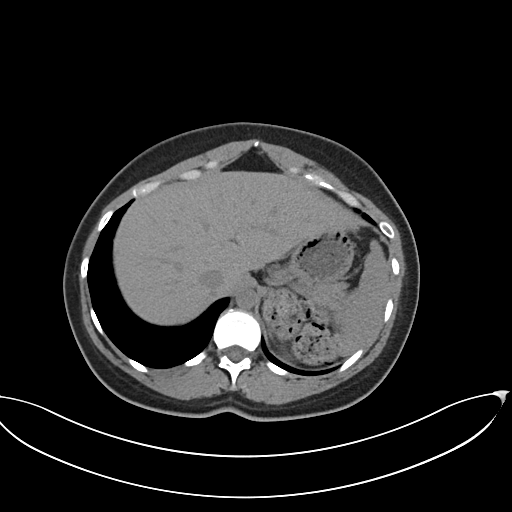
[im 81/85  soft-tissue]
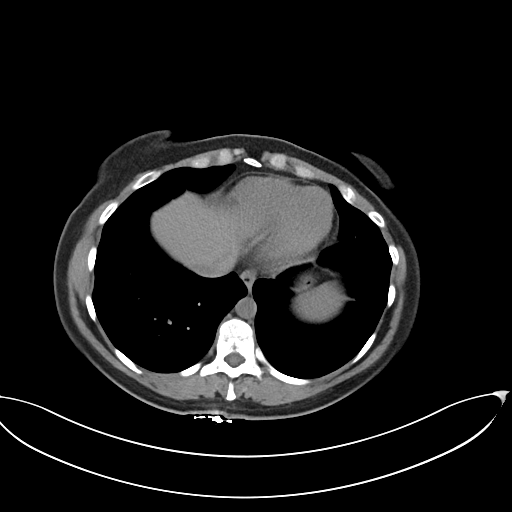

[Series 5: coronal soft tissue · coronal · 0.83mm/px · 3 of 99 slices shown]
[im 33/99  soft-tissue]
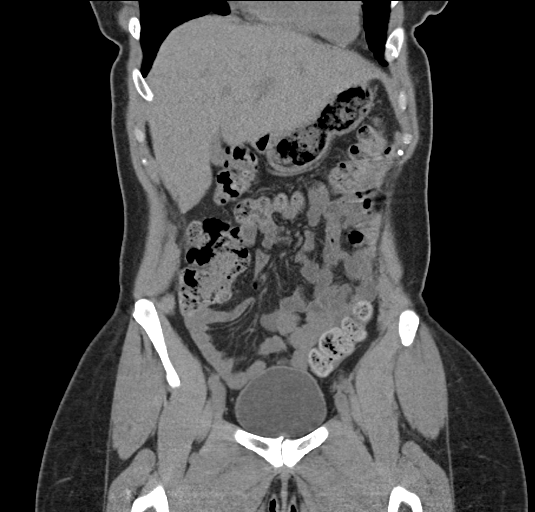
[im 44/99  soft-tissue]
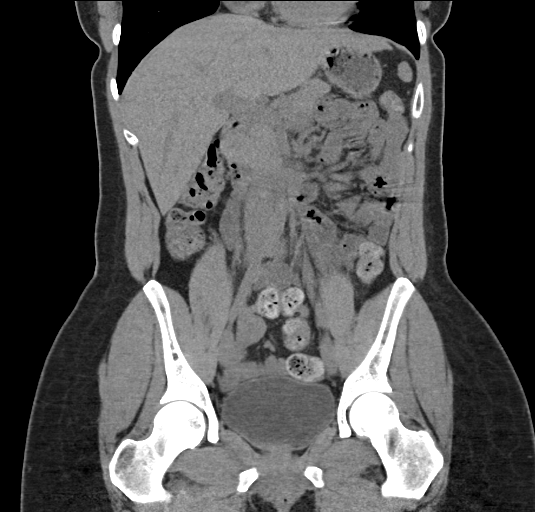
[im 55/99  soft-tissue]
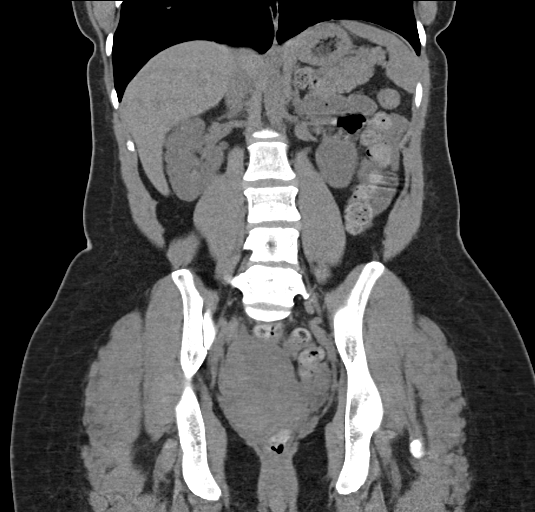

[17 of 46 positions shown; findings below may reference images not displayed]

FINDINGS: Lower chest: No acute abnormality.

Hepatobiliary: No focal liver abnormality is seen. No gallstones,
gallbladder wall thickening, or biliary dilatation.

Pancreas: Unremarkable. No pancreatic ductal dilatation or
surrounding inflammatory changes.

Spleen: Normal in size without focal abnormality.

Adrenals/Urinary Tract: Adrenal glands are unremarkable. Kidneys are
normal, without renal calculi, focal lesion, or hydronephrosis.
Bladder is unremarkable.

Stomach/Bowel: Stomach is within normal limits. Appendix appears
normal. No evidence of bowel wall thickening, distention, or
inflammatory changes. Moderate burden of stool and dense stool balls
in the colon and rectum.

Vascular/Lymphatic: No significant vascular findings are present. No
enlarged abdominal or pelvic lymph nodes.

Reproductive: No mass or other abnormality.

Other: No abdominal wall hernia or abnormality. No abdominopelvic
ascites.

Musculoskeletal: No acute or significant osseous findings.
IMPRESSION: No non-contrast CT findings of the abdomen or pelvis to explain
flank pain. No evidence of urinary tract calculus or hydronephrosis.
